# Patient Record
Sex: Female | Born: 1965 | Race: Black or African American | Hispanic: No | Marital: Single | Smoking: Current every day smoker
Health system: Southern US, Community
[De-identification: ages and names within clinical notes are randomized; demographics above are authoritative.]

## PROBLEM LIST (undated history)

## (undated) DIAGNOSIS — I639 Cerebral infarction, unspecified: Secondary | ICD-10-CM

## (undated) DIAGNOSIS — K219 Gastro-esophageal reflux disease without esophagitis: Secondary | ICD-10-CM

## (undated) DIAGNOSIS — I10 Essential (primary) hypertension: Secondary | ICD-10-CM

## (undated) DIAGNOSIS — E785 Hyperlipidemia, unspecified: Secondary | ICD-10-CM

## (undated) HISTORY — DX: Cerebral infarction, unspecified: I63.9

## (undated) HISTORY — DX: Gastro-esophageal reflux disease without esophagitis: K21.9

## (undated) HISTORY — DX: Hyperlipidemia, unspecified: E78.5

## (undated) HISTORY — DX: Essential (primary) hypertension: I10

---

## 2004-03-21 HISTORY — PX: CRANIOTOMY: SHX93

## 2014-05-16 ENCOUNTER — Other Ambulatory Visit: Payer: Self-pay | Admitting: Physician Assistant

## 2014-05-16 DIAGNOSIS — R140 Abdominal distension (gaseous): Secondary | ICD-10-CM

## 2014-05-19 ENCOUNTER — Other Ambulatory Visit: Payer: Self-pay

## 2014-06-16 ENCOUNTER — Ambulatory Visit
Admission: RE | Admit: 2014-06-16 | Discharge: 2014-06-16 | Disposition: A | Discharge status: Home / Self Care | Payer: Medicare HMO | Source: Ambulatory Visit | Attending: Physician Assistant | Admitting: Physician Assistant

## 2014-06-16 DIAGNOSIS — R140 Abdominal distension (gaseous): Secondary | ICD-10-CM

## 2016-10-24 DIAGNOSIS — Z79899 Other long term (current) drug therapy: Secondary | ICD-10-CM | POA: Diagnosis not present

## 2016-10-26 DIAGNOSIS — Z139 Encounter for screening, unspecified: Secondary | ICD-10-CM | POA: Diagnosis not present

## 2016-11-02 DIAGNOSIS — E785 Hyperlipidemia, unspecified: Secondary | ICD-10-CM | POA: Diagnosis not present

## 2016-11-02 DIAGNOSIS — K219 Gastro-esophageal reflux disease without esophagitis: Secondary | ICD-10-CM | POA: Diagnosis not present

## 2016-11-02 DIAGNOSIS — I6789 Other cerebrovascular disease: Secondary | ICD-10-CM | POA: Diagnosis not present

## 2016-11-02 DIAGNOSIS — I69954 Hemiplegia and hemiparesis following unspecified cerebrovascular disease affecting left non-dominant side: Secondary | ICD-10-CM | POA: Diagnosis not present

## 2016-11-02 DIAGNOSIS — Z720 Tobacco use: Secondary | ICD-10-CM | POA: Diagnosis not present

## 2016-11-02 DIAGNOSIS — I10 Essential (primary) hypertension: Secondary | ICD-10-CM | POA: Diagnosis not present

## 2016-11-14 DIAGNOSIS — Z79899 Other long term (current) drug therapy: Secondary | ICD-10-CM | POA: Diagnosis not present

## 2016-11-30 DIAGNOSIS — Z139 Encounter for screening, unspecified: Secondary | ICD-10-CM | POA: Diagnosis not present

## 2017-01-12 DIAGNOSIS — Z139 Encounter for screening, unspecified: Secondary | ICD-10-CM | POA: Diagnosis not present

## 2017-02-01 DIAGNOSIS — Z139 Encounter for screening, unspecified: Secondary | ICD-10-CM | POA: Diagnosis not present

## 2017-04-04 DIAGNOSIS — Z5181 Encounter for therapeutic drug level monitoring: Secondary | ICD-10-CM | POA: Diagnosis not present

## 2017-04-06 DIAGNOSIS — Z5181 Encounter for therapeutic drug level monitoring: Secondary | ICD-10-CM | POA: Diagnosis not present

## 2017-04-11 DIAGNOSIS — Z79899 Other long term (current) drug therapy: Secondary | ICD-10-CM | POA: Diagnosis not present

## 2017-04-13 DIAGNOSIS — Z5181 Encounter for therapeutic drug level monitoring: Secondary | ICD-10-CM | POA: Diagnosis not present

## 2017-04-17 DIAGNOSIS — I10 Essential (primary) hypertension: Secondary | ICD-10-CM | POA: Diagnosis not present

## 2017-04-17 DIAGNOSIS — R05 Cough: Secondary | ICD-10-CM | POA: Diagnosis not present

## 2017-04-17 DIAGNOSIS — K219 Gastro-esophageal reflux disease without esophagitis: Secondary | ICD-10-CM | POA: Diagnosis not present

## 2017-04-17 DIAGNOSIS — I6789 Other cerebrovascular disease: Secondary | ICD-10-CM | POA: Diagnosis not present

## 2017-04-17 DIAGNOSIS — I69954 Hemiplegia and hemiparesis following unspecified cerebrovascular disease affecting left non-dominant side: Secondary | ICD-10-CM | POA: Diagnosis not present

## 2017-04-17 DIAGNOSIS — E785 Hyperlipidemia, unspecified: Secondary | ICD-10-CM | POA: Diagnosis not present

## 2017-04-17 DIAGNOSIS — R7303 Prediabetes: Secondary | ICD-10-CM | POA: Diagnosis not present

## 2017-04-18 DIAGNOSIS — Z5181 Encounter for therapeutic drug level monitoring: Secondary | ICD-10-CM | POA: Diagnosis not present

## 2017-04-20 DIAGNOSIS — Z5181 Encounter for therapeutic drug level monitoring: Secondary | ICD-10-CM | POA: Diagnosis not present

## 2017-04-24 DIAGNOSIS — Z79899 Other long term (current) drug therapy: Secondary | ICD-10-CM | POA: Diagnosis not present

## 2017-04-26 DIAGNOSIS — Z79899 Other long term (current) drug therapy: Secondary | ICD-10-CM | POA: Diagnosis not present

## 2017-05-01 DIAGNOSIS — K219 Gastro-esophageal reflux disease without esophagitis: Secondary | ICD-10-CM | POA: Diagnosis not present

## 2017-05-01 DIAGNOSIS — I10 Essential (primary) hypertension: Secondary | ICD-10-CM | POA: Diagnosis not present

## 2017-05-01 DIAGNOSIS — E785 Hyperlipidemia, unspecified: Secondary | ICD-10-CM | POA: Diagnosis not present

## 2017-05-01 DIAGNOSIS — Z79899 Other long term (current) drug therapy: Secondary | ICD-10-CM | POA: Diagnosis not present

## 2017-05-01 DIAGNOSIS — I69954 Hemiplegia and hemiparesis following unspecified cerebrovascular disease affecting left non-dominant side: Secondary | ICD-10-CM | POA: Diagnosis not present

## 2017-05-01 DIAGNOSIS — I6789 Other cerebrovascular disease: Secondary | ICD-10-CM | POA: Diagnosis not present

## 2017-05-03 DIAGNOSIS — Z5181 Encounter for therapeutic drug level monitoring: Secondary | ICD-10-CM | POA: Diagnosis not present

## 2017-05-09 DIAGNOSIS — Z5181 Encounter for therapeutic drug level monitoring: Secondary | ICD-10-CM | POA: Diagnosis not present

## 2017-05-11 DIAGNOSIS — Z5181 Encounter for therapeutic drug level monitoring: Secondary | ICD-10-CM | POA: Diagnosis not present

## 2017-05-15 DIAGNOSIS — Z79899 Other long term (current) drug therapy: Secondary | ICD-10-CM | POA: Diagnosis not present

## 2017-05-17 DIAGNOSIS — Z5181 Encounter for therapeutic drug level monitoring: Secondary | ICD-10-CM | POA: Diagnosis not present

## 2017-05-23 DIAGNOSIS — Z5181 Encounter for therapeutic drug level monitoring: Secondary | ICD-10-CM | POA: Diagnosis not present

## 2017-05-25 DIAGNOSIS — Z5181 Encounter for therapeutic drug level monitoring: Secondary | ICD-10-CM | POA: Diagnosis not present

## 2017-06-07 DIAGNOSIS — Z5181 Encounter for therapeutic drug level monitoring: Secondary | ICD-10-CM | POA: Diagnosis not present

## 2017-06-09 DIAGNOSIS — Z5181 Encounter for therapeutic drug level monitoring: Secondary | ICD-10-CM | POA: Diagnosis not present

## 2017-06-12 DIAGNOSIS — Z5181 Encounter for therapeutic drug level monitoring: Secondary | ICD-10-CM | POA: Diagnosis not present

## 2017-06-14 DIAGNOSIS — Z5181 Encounter for therapeutic drug level monitoring: Secondary | ICD-10-CM | POA: Diagnosis not present

## 2017-06-20 DIAGNOSIS — Z5181 Encounter for therapeutic drug level monitoring: Secondary | ICD-10-CM | POA: Diagnosis not present

## 2017-06-22 DIAGNOSIS — Z5181 Encounter for therapeutic drug level monitoring: Secondary | ICD-10-CM | POA: Diagnosis not present

## 2017-06-27 DIAGNOSIS — Z79899 Other long term (current) drug therapy: Secondary | ICD-10-CM | POA: Diagnosis not present

## 2017-06-29 DIAGNOSIS — Z79899 Other long term (current) drug therapy: Secondary | ICD-10-CM | POA: Diagnosis not present

## 2017-07-04 DIAGNOSIS — Z79899 Other long term (current) drug therapy: Secondary | ICD-10-CM | POA: Diagnosis not present

## 2017-07-06 DIAGNOSIS — Z79899 Other long term (current) drug therapy: Secondary | ICD-10-CM | POA: Diagnosis not present

## 2017-07-11 DIAGNOSIS — Z79899 Other long term (current) drug therapy: Secondary | ICD-10-CM | POA: Diagnosis not present

## 2017-07-13 DIAGNOSIS — Z79899 Other long term (current) drug therapy: Secondary | ICD-10-CM | POA: Diagnosis not present

## 2017-07-17 DIAGNOSIS — Z79899 Other long term (current) drug therapy: Secondary | ICD-10-CM | POA: Diagnosis not present

## 2017-07-19 DIAGNOSIS — Z79899 Other long term (current) drug therapy: Secondary | ICD-10-CM | POA: Diagnosis not present

## 2017-07-24 DIAGNOSIS — Z5181 Encounter for therapeutic drug level monitoring: Secondary | ICD-10-CM | POA: Diagnosis not present

## 2017-07-26 DIAGNOSIS — Z79899 Other long term (current) drug therapy: Secondary | ICD-10-CM | POA: Diagnosis not present

## 2017-08-21 DIAGNOSIS — Z5181 Encounter for therapeutic drug level monitoring: Secondary | ICD-10-CM | POA: Diagnosis not present

## 2017-08-23 DIAGNOSIS — Z5181 Encounter for therapeutic drug level monitoring: Secondary | ICD-10-CM | POA: Diagnosis not present

## 2017-10-04 DIAGNOSIS — Z0000 Encounter for general adult medical examination without abnormal findings: Secondary | ICD-10-CM | POA: Diagnosis not present

## 2017-10-04 DIAGNOSIS — I10 Essential (primary) hypertension: Secondary | ICD-10-CM | POA: Diagnosis not present

## 2017-10-04 DIAGNOSIS — I6789 Other cerebrovascular disease: Secondary | ICD-10-CM | POA: Diagnosis not present

## 2017-10-11 DIAGNOSIS — Z0000 Encounter for general adult medical examination without abnormal findings: Secondary | ICD-10-CM | POA: Diagnosis not present

## 2017-10-11 DIAGNOSIS — R1904 Left lower quadrant abdominal swelling, mass and lump: Secondary | ICD-10-CM | POA: Diagnosis not present

## 2017-10-11 DIAGNOSIS — Z720 Tobacco use: Secondary | ICD-10-CM | POA: Diagnosis not present

## 2017-10-11 DIAGNOSIS — Z0110 Encounter for examination of ears and hearing without abnormal findings: Secondary | ICD-10-CM | POA: Diagnosis not present

## 2017-10-11 DIAGNOSIS — Z131 Encounter for screening for diabetes mellitus: Secondary | ICD-10-CM | POA: Diagnosis not present

## 2017-10-11 DIAGNOSIS — I10 Essential (primary) hypertension: Secondary | ICD-10-CM | POA: Diagnosis not present

## 2017-10-11 DIAGNOSIS — Z0100 Encounter for examination of eyes and vision without abnormal findings: Secondary | ICD-10-CM | POA: Diagnosis not present

## 2017-10-25 DIAGNOSIS — H524 Presbyopia: Secondary | ICD-10-CM | POA: Diagnosis not present

## 2017-10-25 DIAGNOSIS — H11153 Pinguecula, bilateral: Secondary | ICD-10-CM | POA: Diagnosis not present

## 2017-10-31 DIAGNOSIS — H5213 Myopia, bilateral: Secondary | ICD-10-CM | POA: Diagnosis not present

## 2018-12-10 DIAGNOSIS — H11153 Pinguecula, bilateral: Secondary | ICD-10-CM | POA: Diagnosis not present

## 2018-12-10 DIAGNOSIS — H524 Presbyopia: Secondary | ICD-10-CM | POA: Diagnosis not present

## 2019-03-06 ENCOUNTER — Other Ambulatory Visit: Payer: Self-pay

## 2019-03-08 ENCOUNTER — Other Ambulatory Visit: Payer: Self-pay | Admitting: Cardiology

## 2019-03-08 DIAGNOSIS — Z20822 Contact with and (suspected) exposure to covid-19: Secondary | ICD-10-CM

## 2019-03-10 LAB — NOVEL CORONAVIRUS, NAA: SARS-CoV-2, NAA: NOT DETECTED

## 2019-03-11 ENCOUNTER — Telehealth: Payer: Self-pay | Admitting: *Deleted

## 2019-03-11 NOTE — Telephone Encounter (Signed)
Patient called given negative covid results . 

## 2019-03-25 DIAGNOSIS — F1920 Other psychoactive substance dependence, uncomplicated: Secondary | ICD-10-CM | POA: Diagnosis not present

## 2019-03-25 DIAGNOSIS — F10120 Alcohol abuse with intoxication, uncomplicated: Secondary | ICD-10-CM | POA: Diagnosis not present

## 2019-05-20 DIAGNOSIS — F10120 Alcohol abuse with intoxication, uncomplicated: Secondary | ICD-10-CM | POA: Diagnosis not present

## 2019-05-20 DIAGNOSIS — F1920 Other psychoactive substance dependence, uncomplicated: Secondary | ICD-10-CM | POA: Diagnosis not present

## 2019-06-06 DIAGNOSIS — E7800 Pure hypercholesterolemia, unspecified: Secondary | ICD-10-CM | POA: Diagnosis not present

## 2019-06-06 DIAGNOSIS — I69954 Hemiplegia and hemiparesis following unspecified cerebrovascular disease affecting left non-dominant side: Secondary | ICD-10-CM | POA: Diagnosis not present

## 2019-06-06 DIAGNOSIS — E559 Vitamin D deficiency, unspecified: Secondary | ICD-10-CM | POA: Diagnosis not present

## 2019-06-06 DIAGNOSIS — I6789 Other cerebrovascular disease: Secondary | ICD-10-CM | POA: Diagnosis not present

## 2019-06-06 DIAGNOSIS — R6889 Other general symptoms and signs: Secondary | ICD-10-CM | POA: Diagnosis not present

## 2019-06-06 DIAGNOSIS — I10 Essential (primary) hypertension: Secondary | ICD-10-CM | POA: Diagnosis not present

## 2019-06-06 DIAGNOSIS — K219 Gastro-esophageal reflux disease without esophagitis: Secondary | ICD-10-CM | POA: Diagnosis not present

## 2019-06-06 DIAGNOSIS — Z720 Tobacco use: Secondary | ICD-10-CM | POA: Diagnosis not present

## 2019-07-02 DIAGNOSIS — R6889 Other general symptoms and signs: Secondary | ICD-10-CM | POA: Diagnosis not present

## 2019-07-02 DIAGNOSIS — Z5181 Encounter for therapeutic drug level monitoring: Secondary | ICD-10-CM | POA: Diagnosis not present

## 2019-07-02 DIAGNOSIS — Z01021 Encounter for examination of eyes and vision following failed vision screening with abnormal findings: Secondary | ICD-10-CM | POA: Diagnosis not present

## 2019-07-02 DIAGNOSIS — Z131 Encounter for screening for diabetes mellitus: Secondary | ICD-10-CM | POA: Diagnosis not present

## 2019-07-02 DIAGNOSIS — Z0001 Encounter for general adult medical examination with abnormal findings: Secondary | ICD-10-CM | POA: Diagnosis not present

## 2019-07-02 DIAGNOSIS — Z1389 Encounter for screening for other disorder: Secondary | ICD-10-CM | POA: Diagnosis not present

## 2019-07-02 DIAGNOSIS — Z136 Encounter for screening for cardiovascular disorders: Secondary | ICD-10-CM | POA: Diagnosis not present

## 2019-07-02 DIAGNOSIS — Z1329 Encounter for screening for other suspected endocrine disorder: Secondary | ICD-10-CM | POA: Diagnosis not present

## 2019-07-08 DIAGNOSIS — F1220 Cannabis dependence, uncomplicated: Secondary | ICD-10-CM | POA: Diagnosis not present

## 2019-07-08 DIAGNOSIS — F1920 Other psychoactive substance dependence, uncomplicated: Secondary | ICD-10-CM | POA: Diagnosis not present

## 2019-07-08 DIAGNOSIS — F1410 Cocaine abuse, uncomplicated: Secondary | ICD-10-CM | POA: Diagnosis not present

## 2019-07-08 DIAGNOSIS — F1020 Alcohol dependence, uncomplicated: Secondary | ICD-10-CM | POA: Diagnosis not present

## 2019-07-25 DIAGNOSIS — I10 Essential (primary) hypertension: Secondary | ICD-10-CM | POA: Diagnosis not present

## 2019-07-25 DIAGNOSIS — E559 Vitamin D deficiency, unspecified: Secondary | ICD-10-CM | POA: Diagnosis not present

## 2019-07-25 DIAGNOSIS — I6789 Other cerebrovascular disease: Secondary | ICD-10-CM | POA: Diagnosis not present

## 2019-07-25 DIAGNOSIS — E7800 Pure hypercholesterolemia, unspecified: Secondary | ICD-10-CM | POA: Diagnosis not present

## 2019-07-25 DIAGNOSIS — I69954 Hemiplegia and hemiparesis following unspecified cerebrovascular disease affecting left non-dominant side: Secondary | ICD-10-CM | POA: Diagnosis not present

## 2019-07-25 DIAGNOSIS — Z720 Tobacco use: Secondary | ICD-10-CM | POA: Diagnosis not present

## 2019-07-25 DIAGNOSIS — K219 Gastro-esophageal reflux disease without esophagitis: Secondary | ICD-10-CM | POA: Diagnosis not present

## 2019-07-25 DIAGNOSIS — R1900 Intra-abdominal and pelvic swelling, mass and lump, unspecified site: Secondary | ICD-10-CM | POA: Diagnosis not present

## 2019-07-25 DIAGNOSIS — R6889 Other general symptoms and signs: Secondary | ICD-10-CM | POA: Diagnosis not present

## 2019-08-21 ENCOUNTER — Emergency Department (HOSPITAL_COMMUNITY)
Admission: EM | Admit: 2019-08-21 | Discharge: 2019-08-21 | Disposition: A | Discharge status: Home / Self Care | Payer: Medicare HMO | Attending: Emergency Medicine | Admitting: Emergency Medicine

## 2019-08-21 ENCOUNTER — Other Ambulatory Visit: Payer: Self-pay

## 2019-08-21 ENCOUNTER — Encounter (HOSPITAL_COMMUNITY): Payer: Self-pay | Admitting: Emergency Medicine

## 2019-08-21 DIAGNOSIS — A599 Trichomoniasis, unspecified: Secondary | ICD-10-CM | POA: Diagnosis not present

## 2019-08-21 DIAGNOSIS — N898 Other specified noninflammatory disorders of vagina: Secondary | ICD-10-CM | POA: Diagnosis present

## 2019-08-21 LAB — WET PREP, GENITAL
Clue Cells Wet Prep HPF POC: NONE SEEN
Sperm: NONE SEEN
Yeast Wet Prep HPF POC: NONE SEEN

## 2019-08-21 LAB — HIV ANTIBODY (ROUTINE TESTING W REFLEX): HIV Screen 4th Generation wRfx: NONREACTIVE

## 2019-08-21 MED ORDER — STERILE WATER FOR INJECTION IJ SOLN
INTRAMUSCULAR | Status: AC
  Administered 2019-08-21: 1 mL
  Filled 2019-08-21: qty 10

## 2019-08-21 MED ORDER — CEFTRIAXONE SODIUM 500 MG IJ SOLR
500.0000 mg | Freq: Once | INTRAMUSCULAR | Status: AC
  Administered 2019-08-21: 500 mg via INTRAMUSCULAR
  Filled 2019-08-21: qty 500

## 2019-08-21 MED ORDER — METRONIDAZOLE 500 MG PO TABS
2000.0000 mg | ORAL_TABLET | Freq: Once | ORAL | Status: AC
  Administered 2019-08-21: 2000 mg via ORAL
  Filled 2019-08-21: qty 4

## 2019-08-21 NOTE — ED Provider Notes (Signed)
[facility] EMERGENCY DEPARTMENT Provider Note   CSN: [id number] Arrival date & time: 08/21/19  1400     History Chief Complaint  Patient presents with  . Vaginal Discharge    Danielle Carey is a 54 y.o. female.  The history is provided by the patient. No language interpreter was used.  Vaginal Discharge     54 year old female presenting with concerns of potential STI.  Patient states she recently had a new sexual partner.  She normally engaged in sexual activities while using condoms.  Approximately 2 weeks ago she noticed that her partner did not have a condom on.  She confronted her partner who states that he "thought I had a condom on".  She is here requesting for an STI screening and examination.  She wants to make sure that is not a retained condom.  She denies any dysuria, hematuria, vaginal bleeding, vaginal discharge, strong odor, or abdominal pain or rash.  She denies any prior history of STI.  No other complaint.  History reviewed. No pertinent past medical history.  There are no problems to display for this patient.   The histories are not reviewed yet. Please review them in the "History" navigator section and refresh this [facility].   OB History   No obstetric history on file.     No family history on file.  Social History   Tobacco Use  . Smoking status: Not on file  Substance Use Topics  . Alcohol use: Not on file  . Drug use: Not on file    Home Medications Prior to Admission medications   Not on File    Allergies    Patient has no known allergies.  Review of Systems   Review of Systems  Genitourinary: Positive for vaginal discharge.  All other systems reviewed and are negative.   Physical Exam Updated Vital Signs BP (!) 174/87   Pulse 80   Temp 98.8 F (37.1 C)   Resp 14   SpO2 99%   Physical Exam Vitals and nursing note reviewed.  Constitutional:      General: She is not in acute distress.    Appearance: She  is well-developed.  HENT:     Head: Atraumatic.  Eyes:     Conjunctiva/sclera: Conjunctivae normal.  Abdominal:     Palpations: Abdomen is soft.     Tenderness: There is no abdominal tenderness.  Genitourinary:    Comments: Chaperone present during exam.  No inguinal lymphadenopathy or inguinal hernia noted.  Normal external genitalia.  No significant discomfort with speculum insertion.  Moderate amount of greenish-yellow discharge noted in vaginal vault.  Closed cervical os free of lesion or rash.  No foreign body noted in the vault.  On bimanual examination no adnexal tenderness or cervical motion tenderness. Musculoskeletal:     Cervical back: Neck supple.  Skin:    Findings: No rash.  Neurological:     Mental Status: She is alert.     ED Results / Procedures / Treatments   Labs (all labs ordered are listed, but only abnormal results are displayed) Labs Reviewed  WET PREP, GENITAL - Abnormal; Notable for the following components:      Result Value   Trich, Wet Prep PRESENT (*)    WBC, Wet Prep HPF POC MODERATE (*)    All other components within normal limits  RPR  URINALYSIS, ROUTINE W REFLEX MICROSCOPIC  HIV ANTIBODY (ROUTINE TESTING W REFLEX)  GC/CHLAMYDIA PROBE AMP ([place]) NOT AT  [facility]    EKG None  Radiology No results found.  Procedures Procedures (including critical care time)  Medications Ordered in ED Medications  metroNIDAZOLE (FLAGYL) tablet 2,000 mg (has no administration in time range)  cefTRIAXone (ROCEPHIN) injection 500 mg (has no administration in time range)  sterile water (preservative free) injection (has no administration in time range)    ED Course  I have reviewed the triage vital signs and the nursing notes.  Pertinent labs & imaging results that were available during my care of the patient were reviewed by me and considered in my medical decision making (see chart for details).    MDM Rules/Calculators/A&P                       BP 127/79   Pulse (!) 56   Temp 98.8 F (37.1 C)   Resp 14   Ht 5\' 5"  (1.651 m)   Wt 59 kg   SpO2 100%   BMI 21.63 kg/m   Final Clinical Impression(s) / ED Diagnoses Final diagnoses:  Trichomonal infection    Rx / DC Orders ED Discharge Orders    None     4:00 PM Request to be screened for STI after noticing her partner did not have his condom on during the last sexual encounters 2 weeks ago.  She does not have any symptoms.  4:53 PM Wet prep with moderate amount of vaginal discharge but no significant pain to suggest PID.  Results of the wet prep shows presence of trichomonas and moderate WBC.  I discussed finding with patient and her request for prophylactic antibiotic as well as treatment for trichomonas.  Will give Rocephin 500 mg IM, and Flagyl 2 g p.o.   , PA-C 08/21/19 1716    10/21/19, MD 08/22/19 2046

## 2019-08-21 NOTE — Discharge Instructions (Addendum)
You have been evaluated for your symptoms and you have been diagnosed with trichomoniasis.  You have received antibiotics as treatment.  Please request for your partner to get tested and treated as well.  Return if you have any concern.
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
## 2019-08-22 LAB — GC/CHLAMYDIA PROBE AMP (~~LOC~~) NOT AT ARMC
Chlamydia: NEGATIVE
Comment: NEGATIVE
Comment: NORMAL
Neisseria Gonorrhea: NEGATIVE

## 2019-08-22 LAB — RPR: RPR Ser Ql: NONREACTIVE

## 2019-10-15 DIAGNOSIS — I6789 Other cerebrovascular disease: Secondary | ICD-10-CM | POA: Diagnosis not present

## 2019-10-15 DIAGNOSIS — E7800 Pure hypercholesterolemia, unspecified: Secondary | ICD-10-CM | POA: Diagnosis not present

## 2019-10-15 DIAGNOSIS — I10 Essential (primary) hypertension: Secondary | ICD-10-CM | POA: Diagnosis not present

## 2019-10-15 DIAGNOSIS — Z0001 Encounter for general adult medical examination with abnormal findings: Secondary | ICD-10-CM | POA: Diagnosis not present

## 2019-10-15 DIAGNOSIS — I69954 Hemiplegia and hemiparesis following unspecified cerebrovascular disease affecting left non-dominant side: Secondary | ICD-10-CM | POA: Diagnosis not present

## 2019-10-15 DIAGNOSIS — E559 Vitamin D deficiency, unspecified: Secondary | ICD-10-CM | POA: Diagnosis not present

## 2019-10-15 DIAGNOSIS — K219 Gastro-esophageal reflux disease without esophagitis: Secondary | ICD-10-CM | POA: Diagnosis not present

## 2019-10-15 DIAGNOSIS — Z720 Tobacco use: Secondary | ICD-10-CM | POA: Diagnosis not present

## 2019-12-12 DIAGNOSIS — H11153 Pinguecula, bilateral: Secondary | ICD-10-CM | POA: Diagnosis not present

## 2019-12-12 DIAGNOSIS — H524 Presbyopia: Secondary | ICD-10-CM | POA: Diagnosis not present

## 2019-12-24 DIAGNOSIS — E7800 Pure hypercholesterolemia, unspecified: Secondary | ICD-10-CM | POA: Diagnosis not present

## 2019-12-24 DIAGNOSIS — H5213 Myopia, bilateral: Secondary | ICD-10-CM | POA: Diagnosis not present

## 2019-12-24 DIAGNOSIS — E559 Vitamin D deficiency, unspecified: Secondary | ICD-10-CM | POA: Diagnosis not present

## 2019-12-24 DIAGNOSIS — I6789 Other cerebrovascular disease: Secondary | ICD-10-CM | POA: Diagnosis not present

## 2019-12-24 DIAGNOSIS — I10 Essential (primary) hypertension: Secondary | ICD-10-CM | POA: Diagnosis not present

## 2019-12-24 DIAGNOSIS — K219 Gastro-esophageal reflux disease without esophagitis: Secondary | ICD-10-CM | POA: Diagnosis not present

## 2020-02-05 DIAGNOSIS — H524 Presbyopia: Secondary | ICD-10-CM | POA: Diagnosis not present

## 2020-04-01 DIAGNOSIS — Z79899 Other long term (current) drug therapy: Secondary | ICD-10-CM | POA: Diagnosis not present

## 2020-04-13 DIAGNOSIS — Z79899 Other long term (current) drug therapy: Secondary | ICD-10-CM | POA: Diagnosis not present

## 2020-06-24 DIAGNOSIS — Z79899 Other long term (current) drug therapy: Secondary | ICD-10-CM | POA: Diagnosis not present

## 2020-08-05 DIAGNOSIS — Z79899 Other long term (current) drug therapy: Secondary | ICD-10-CM | POA: Diagnosis not present

## 2020-10-15 DIAGNOSIS — E559 Vitamin D deficiency, unspecified: Secondary | ICD-10-CM | POA: Diagnosis not present

## 2020-10-15 DIAGNOSIS — I69954 Hemiplegia and hemiparesis following unspecified cerebrovascular disease affecting left non-dominant side: Secondary | ICD-10-CM | POA: Diagnosis not present

## 2020-10-15 DIAGNOSIS — R6889 Other general symptoms and signs: Secondary | ICD-10-CM | POA: Diagnosis not present

## 2020-10-15 DIAGNOSIS — Z0001 Encounter for general adult medical examination with abnormal findings: Secondary | ICD-10-CM | POA: Diagnosis not present

## 2020-10-15 DIAGNOSIS — I6789 Other cerebrovascular disease: Secondary | ICD-10-CM | POA: Diagnosis not present

## 2020-10-15 DIAGNOSIS — I10 Essential (primary) hypertension: Secondary | ICD-10-CM | POA: Diagnosis not present

## 2020-10-15 DIAGNOSIS — Z720 Tobacco use: Secondary | ICD-10-CM | POA: Diagnosis not present

## 2020-10-15 DIAGNOSIS — E7800 Pure hypercholesterolemia, unspecified: Secondary | ICD-10-CM | POA: Diagnosis not present

## 2020-10-15 DIAGNOSIS — K219 Gastro-esophageal reflux disease without esophagitis: Secondary | ICD-10-CM | POA: Diagnosis not present

## 2020-10-19 ENCOUNTER — Other Ambulatory Visit: Payer: Self-pay | Admitting: Physician Assistant

## 2020-10-19 DIAGNOSIS — Z1231 Encounter for screening mammogram for malignant neoplasm of breast: Secondary | ICD-10-CM

## 2020-12-09 DIAGNOSIS — I6789 Other cerebrovascular disease: Secondary | ICD-10-CM | POA: Diagnosis not present

## 2020-12-09 DIAGNOSIS — E7800 Pure hypercholesterolemia, unspecified: Secondary | ICD-10-CM | POA: Diagnosis not present

## 2020-12-09 DIAGNOSIS — I69954 Hemiplegia and hemiparesis following unspecified cerebrovascular disease affecting left non-dominant side: Secondary | ICD-10-CM | POA: Diagnosis not present

## 2020-12-09 DIAGNOSIS — I10 Essential (primary) hypertension: Secondary | ICD-10-CM | POA: Diagnosis not present

## 2020-12-09 DIAGNOSIS — K219 Gastro-esophageal reflux disease without esophagitis: Secondary | ICD-10-CM | POA: Diagnosis not present

## 2020-12-09 DIAGNOSIS — E559 Vitamin D deficiency, unspecified: Secondary | ICD-10-CM | POA: Diagnosis not present

## 2020-12-09 DIAGNOSIS — Z720 Tobacco use: Secondary | ICD-10-CM | POA: Diagnosis not present

## 2020-12-11 DIAGNOSIS — Z79899 Other long term (current) drug therapy: Secondary | ICD-10-CM | POA: Diagnosis not present

## 2020-12-14 DIAGNOSIS — Z79899 Other long term (current) drug therapy: Secondary | ICD-10-CM | POA: Diagnosis not present

## 2020-12-15 ENCOUNTER — Ambulatory Visit: Payer: Medicare HMO

## 2020-12-23 DIAGNOSIS — Z79899 Other long term (current) drug therapy: Secondary | ICD-10-CM | POA: Diagnosis not present

## 2020-12-30 DIAGNOSIS — Z79899 Other long term (current) drug therapy: Secondary | ICD-10-CM | POA: Diagnosis not present

## 2021-01-09 ENCOUNTER — Other Ambulatory Visit: Payer: Self-pay

## 2021-01-09 ENCOUNTER — Emergency Department (HOSPITAL_COMMUNITY)
Admission: EM | Admit: 2021-01-09 | Discharge: 2021-01-09 | Disposition: A | Discharge status: Home / Self Care | Payer: Medicare HMO | Attending: Emergency Medicine | Admitting: Emergency Medicine

## 2021-01-09 ENCOUNTER — Encounter (HOSPITAL_COMMUNITY): Payer: Self-pay

## 2021-01-09 ENCOUNTER — Emergency Department (HOSPITAL_COMMUNITY): Payer: Medicare HMO

## 2021-01-09 DIAGNOSIS — M542 Cervicalgia: Secondary | ICD-10-CM | POA: Diagnosis not present

## 2021-01-09 DIAGNOSIS — M25561 Pain in right knee: Secondary | ICD-10-CM | POA: Diagnosis not present

## 2021-01-09 DIAGNOSIS — M25511 Pain in right shoulder: Secondary | ICD-10-CM | POA: Diagnosis not present

## 2021-01-09 MED ORDER — METHOCARBAMOL 500 MG PO TABS
500.0000 mg | ORAL_TABLET | Freq: Two times a day (BID) | ORAL | 0 refills | Status: AC
Start: 2021-01-09 — End: ?
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
## 2021-02-03 DIAGNOSIS — Z1152 Encounter for screening for COVID-19: Secondary | ICD-10-CM | POA: Diagnosis not present

## 2021-02-03 DIAGNOSIS — Z79899 Other long term (current) drug therapy: Secondary | ICD-10-CM | POA: Diagnosis not present

## 2021-03-02 DIAGNOSIS — Z1152 Encounter for screening for COVID-19: Secondary | ICD-10-CM | POA: Diagnosis not present

## 2021-03-24 DIAGNOSIS — Z1152 Encounter for screening for COVID-19: Secondary | ICD-10-CM | POA: Diagnosis not present

## 2021-04-07 DIAGNOSIS — Z79899 Other long term (current) drug therapy: Secondary | ICD-10-CM | POA: Diagnosis not present

## 2021-04-14 DIAGNOSIS — Z79899 Other long term (current) drug therapy: Secondary | ICD-10-CM | POA: Diagnosis not present

## 2021-04-14 DIAGNOSIS — I10 Essential (primary) hypertension: Secondary | ICD-10-CM | POA: Diagnosis not present

## 2021-04-14 DIAGNOSIS — F17210 Nicotine dependence, cigarettes, uncomplicated: Secondary | ICD-10-CM | POA: Diagnosis not present

## 2021-06-29 ENCOUNTER — Other Ambulatory Visit: Payer: Self-pay | Admitting: Physician Assistant

## 2021-06-29 DIAGNOSIS — Z1231 Encounter for screening mammogram for malignant neoplasm of breast: Secondary | ICD-10-CM
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
## 2023-07-26 DIAGNOSIS — E7800 Pure hypercholesterolemia, unspecified: Secondary | ICD-10-CM | POA: Diagnosis not present

## 2023-07-26 DIAGNOSIS — I10 Essential (primary) hypertension: Secondary | ICD-10-CM | POA: Diagnosis not present

## 2023-07-26 DIAGNOSIS — I6789 Other cerebrovascular disease: Secondary | ICD-10-CM | POA: Diagnosis not present

## 2023-07-26 DIAGNOSIS — E559 Vitamin D deficiency, unspecified: Secondary | ICD-10-CM | POA: Diagnosis not present

## 2023-07-26 DIAGNOSIS — I69954 Hemiplegia and hemiparesis following unspecified cerebrovascular disease affecting left non-dominant side: Secondary | ICD-10-CM | POA: Diagnosis not present

## 2023-08-01 ENCOUNTER — Other Ambulatory Visit: Payer: Self-pay | Admitting: Physician Assistant

## 2023-08-01 DIAGNOSIS — I6789 Other cerebrovascular disease: Secondary | ICD-10-CM | POA: Diagnosis not present

## 2023-08-01 DIAGNOSIS — I69954 Hemiplegia and hemiparesis following unspecified cerebrovascular disease affecting left non-dominant side: Secondary | ICD-10-CM | POA: Diagnosis not present

## 2023-08-01 DIAGNOSIS — I10 Essential (primary) hypertension: Secondary | ICD-10-CM | POA: Diagnosis not present

## 2023-08-01 DIAGNOSIS — K219 Gastro-esophageal reflux disease without esophagitis: Secondary | ICD-10-CM | POA: Diagnosis not present

## 2023-08-01 DIAGNOSIS — E559 Vitamin D deficiency, unspecified: Secondary | ICD-10-CM | POA: Diagnosis not present

## 2023-08-01 DIAGNOSIS — Z131 Encounter for screening for diabetes mellitus: Secondary | ICD-10-CM | POA: Diagnosis not present

## 2023-08-01 DIAGNOSIS — E7800 Pure hypercholesterolemia, unspecified: Secondary | ICD-10-CM | POA: Diagnosis not present

## 2023-08-01 DIAGNOSIS — Z1231 Encounter for screening mammogram for malignant neoplasm of breast: Secondary | ICD-10-CM

## 2023-08-01 DIAGNOSIS — Z720 Tobacco use: Secondary | ICD-10-CM | POA: Diagnosis not present

## 2023-08-01 DIAGNOSIS — Z0000 Encounter for general adult medical examination without abnormal findings: Secondary | ICD-10-CM | POA: Diagnosis not present

## 2023-08-10 DIAGNOSIS — I10 Essential (primary) hypertension: Secondary | ICD-10-CM | POA: Diagnosis not present

## 2023-08-10 DIAGNOSIS — I69954 Hemiplegia and hemiparesis following unspecified cerebrovascular disease affecting left non-dominant side: Secondary | ICD-10-CM | POA: Diagnosis not present

## 2023-08-10 DIAGNOSIS — I6789 Other cerebrovascular disease: Secondary | ICD-10-CM | POA: Diagnosis not present

## 2023-08-10 DIAGNOSIS — Z0001 Encounter for general adult medical examination with abnormal findings: Secondary | ICD-10-CM | POA: Diagnosis not present

## 2023-08-10 DIAGNOSIS — Z720 Tobacco use: Secondary | ICD-10-CM | POA: Diagnosis not present

## 2023-08-10 DIAGNOSIS — K219 Gastro-esophageal reflux disease without esophagitis: Secondary | ICD-10-CM | POA: Diagnosis not present

## 2023-08-10 DIAGNOSIS — E559 Vitamin D deficiency, unspecified: Secondary | ICD-10-CM | POA: Diagnosis not present

## 2023-08-10 DIAGNOSIS — E7800 Pure hypercholesterolemia, unspecified: Secondary | ICD-10-CM | POA: Diagnosis not present

## 2023-08-16 ENCOUNTER — Ambulatory Visit

## 2023-08-16 ENCOUNTER — Ambulatory Visit
Admission: RE | Admit: 2023-08-16 | Discharge: 2023-08-16 | Disposition: A | Discharge status: Home / Self Care | Source: Ambulatory Visit | Attending: Physician Assistant | Admitting: Physician Assistant

## 2023-08-16 DIAGNOSIS — Z1231 Encounter for screening mammogram for malignant neoplasm of breast: Secondary | ICD-10-CM

## 2023-08-21 ENCOUNTER — Other Ambulatory Visit: Payer: Self-pay | Admitting: Physician Assistant

## 2023-08-21 DIAGNOSIS — R928 Other abnormal and inconclusive findings on diagnostic imaging of breast: Secondary | ICD-10-CM

## 2023-08-31 ENCOUNTER — Ambulatory Visit: Admitting: *Deleted

## 2023-08-31 VITALS — Ht 61.0 in | Wt 147.0 lb

## 2023-08-31 DIAGNOSIS — I639 Cerebral infarction, unspecified: Secondary | ICD-10-CM | POA: Insufficient documentation

## 2023-08-31 DIAGNOSIS — I10 Essential (primary) hypertension: Secondary | ICD-10-CM | POA: Insufficient documentation

## 2023-08-31 DIAGNOSIS — Z1211 Encounter for screening for malignant neoplasm of colon: Secondary | ICD-10-CM

## 2023-08-31 DIAGNOSIS — K219 Gastro-esophageal reflux disease without esophagitis: Secondary | ICD-10-CM | POA: Insufficient documentation

## 2023-08-31 MED ORDER — NA SULFATE-K SULFATE-MG SULF 17.5-3.13-1.6 GM/177ML PO SOLN: 1.0000 | Freq: Once | ORAL | 0 refills | Status: AC

## 2023-08-31 NOTE — Progress Notes (Signed)
 Pre visit completed over telephone. Instructions mailed to home address.   No egg or soy allergy known to patient  No issues known to pt with past sedation with any surgeries or procedures Patient denies ever being told they had issues or difficulty with intubation  No FH of Malignant Hyperthermia Pt is not on diet pills Pt is not on  home 02  Pt is not on blood thinners  Pt denies issues with constipation  No A fib or A flutter Have any cardiac testing pending-- NO Pt instructed to use [email or web address] or GoodRx for a price reduction on prep

## 2023-09-06 ENCOUNTER — Other Ambulatory Visit

## 2023-09-06 ENCOUNTER — Encounter

## 2023-09-13 ENCOUNTER — Other Ambulatory Visit: Payer: Self-pay | Admitting: Physician Assistant

## 2023-09-13 ENCOUNTER — Ambulatory Visit
Admission: RE | Admit: 2023-09-13 | Discharge: 2023-09-13 | Disposition: A | Discharge status: Home / Self Care | Source: Ambulatory Visit | Attending: Physician Assistant | Admitting: Physician Assistant

## 2023-09-13 ENCOUNTER — Ambulatory Visit
Admission: RE | Admit: 2023-09-13 | Discharge: 2023-09-13 | Discharge status: Home / Self Care | Source: Ambulatory Visit | Attending: Physician Assistant | Admitting: Physician Assistant

## 2023-09-13 DIAGNOSIS — R928 Other abnormal and inconclusive findings on diagnostic imaging of breast: Secondary | ICD-10-CM

## 2023-09-13 DIAGNOSIS — N6489 Other specified disorders of breast: Secondary | ICD-10-CM

## 2023-09-19 NOTE — Progress Notes (Deleted)
 [facility] History and Physical   Primary Care Physician:  Catalina Bare, MD   Reason for Procedure:  Colon cancer screening  Plan:    Colonoscopy     HPI: Danielle Carey is a 58 y.o. female presenting for a screening colonoscopy   Past Medical History:  Diagnosis Date   GERD (gastroesophageal reflux disease)    Hyperlipidemia    Hypertension    Stroke [facility])     Past Surgical History:  Procedure Laterality Date   CRANIOTOMY Left 2006    Prior to Admission medications   Medication Sig Start Date End Date Taking? Authorizing Provider  hydrochlorothiazide (HYDRODIURIL) 25 MG tablet Take 25 mg by mouth daily. 04/16/23   [provider]  methocarbamol  (ROBAXIN ) 500 MG tablet Take 1 tablet (500 mg total) by mouth 2 (two) times daily. 01/09/21   Roemhildt, Lorin T, PA-C  omeprazole (PRILOSEC) 20 MG capsule Take 20 mg by mouth daily. 04/20/23   [provider]    Current Outpatient Medications  Medication Sig Dispense Refill   hydrochlorothiazide (HYDRODIURIL) 25 MG tablet Take 25 mg by mouth daily.     methocarbamol  (ROBAXIN ) 500 MG tablet Take 1 tablet (500 mg total) by mouth 2 (two) times daily. 20 tablet 0   omeprazole (PRILOSEC) 20 MG capsule Take 20 mg by mouth daily.     No current facility-administered medications for this visit.    Allergies as of 09/20/2023   (No Known Allergies)    Family History  Problem Relation Age of Onset   Colon cancer Neg Hx    Colon polyps Neg Hx     Social History   Socioeconomic History   Marital status: Single    Spouse name: Not on file   Number of children: Not on file   Years of education: Not on file   Highest education level: Not on file  Occupational History   Not on file  Tobacco Use   Smoking status: Every Day    Current packs/day: 0.50    Types: Cigarettes   Smokeless tobacco: Not on file  Substance and Sexual Activity   Alcohol use: Not on file   Drug use: Not on file    Sexual activity: Not on file  Other Topics Concern   Not on file  Social History Narrative   Not on file   Social Drivers of Health   Financial Resource Strain: Not on file  Food Insecurity: Not on file  Transportation Needs: Not on file  Physical Activity: Not on file  Stress: Not on file  Social Connections: Not on file  Intimate Partner Violence: Not on file    Review of Systems: Positive for *** All other review of systems negative except as mentioned in the HPI.  Physical Exam: Vital signs There were no vitals taken for this visit.  General:   Alert,  Well-developed, well-nourished, pleasant and cooperative in NAD Lungs:  Clear throughout to auscultation.   Heart:  Regular rate and rhythm; no murmurs, clicks, rubs,  or gallops. Abdomen:  Soft, nontender and nondistended. Normal bowel sounds.   Neuro/Psych:  Alert and cooperative. Normal mood and affect. A and O x 3   @Onita Pfluger  CHARLENA Commander, MD, [facility] [phone] (pager) 09/19/2023 5:55 PM@

## 2023-09-20 ENCOUNTER — Encounter: Admitting: Internal Medicine

## 2023-09-20 ENCOUNTER — Telehealth: Payer: Self-pay | Admitting: Internal Medicine

## 2023-09-20 NOTE — Telephone Encounter (Signed)
 Called Danielle Carey twice to check if she was still coming in today.No answer. Left a voicemail.

## 2023-09-28 DIAGNOSIS — I10 Essential (primary) hypertension: Secondary | ICD-10-CM | POA: Diagnosis not present

## 2023-11-13 DIAGNOSIS — E559 Vitamin D deficiency, unspecified: Secondary | ICD-10-CM | POA: Diagnosis not present

## 2023-11-13 DIAGNOSIS — K219 Gastro-esophageal reflux disease without esophagitis: Secondary | ICD-10-CM | POA: Diagnosis not present

## 2023-11-13 DIAGNOSIS — I10 Essential (primary) hypertension: Secondary | ICD-10-CM | POA: Diagnosis not present

## 2023-11-13 DIAGNOSIS — E7800 Pure hypercholesterolemia, unspecified: Secondary | ICD-10-CM | POA: Diagnosis not present

## 2023-11-13 DIAGNOSIS — Z720 Tobacco use: Secondary | ICD-10-CM | POA: Diagnosis not present

## 2023-11-13 DIAGNOSIS — I69954 Hemiplegia and hemiparesis following unspecified cerebrovascular disease affecting left non-dominant side: Secondary | ICD-10-CM | POA: Diagnosis not present

## 2023-11-13 DIAGNOSIS — I6789 Other cerebrovascular disease: Secondary | ICD-10-CM | POA: Diagnosis not present

## 2024-03-18 ENCOUNTER — Encounter

## 2024-03-22 ENCOUNTER — Ambulatory Visit
Admission: RE | Admit: 2024-03-22 | Discharge: 2024-03-22 | Disposition: A | Discharge status: Home / Self Care | Source: Ambulatory Visit | Attending: Physician Assistant | Admitting: Physician Assistant

## 2024-03-22 ENCOUNTER — Other Ambulatory Visit: Payer: Self-pay | Admitting: Physician Assistant

## 2024-03-22 DIAGNOSIS — R928 Other abnormal and inconclusive findings on diagnostic imaging of breast: Secondary | ICD-10-CM

## 2024-03-22 DIAGNOSIS — N6489 Other specified disorders of breast: Secondary | ICD-10-CM
